# Patient Record
Sex: Female | Born: 1995 | Race: Black or African American | Hispanic: No | Marital: Single | Smoking: Never smoker
Health system: Southern US, Community
[De-identification: ages and names within clinical notes are randomized; demographics above are authoritative.]

## PROBLEM LIST (undated history)

## (undated) DIAGNOSIS — N809 Endometriosis, unspecified: Secondary | ICD-10-CM

## (undated) HISTORY — PX: TYMPANOSTOMY TUBE PLACEMENT: SHX32

---

## 2016-02-12 DIAGNOSIS — F4322 Adjustment disorder with anxiety: Secondary | ICD-10-CM | POA: Diagnosis not present

## 2016-02-26 DIAGNOSIS — F4322 Adjustment disorder with anxiety: Secondary | ICD-10-CM | POA: Diagnosis not present

## 2016-03-11 DIAGNOSIS — F4322 Adjustment disorder with anxiety: Secondary | ICD-10-CM | POA: Diagnosis not present

## 2016-06-20 DIAGNOSIS — S80912A Unspecified superficial injury of left knee, initial encounter: Secondary | ICD-10-CM | POA: Diagnosis not present

## 2016-06-20 DIAGNOSIS — S80212A Abrasion, left knee, initial encounter: Secondary | ICD-10-CM | POA: Diagnosis not present

## 2016-06-20 DIAGNOSIS — M25572 Pain in left ankle and joints of left foot: Secondary | ICD-10-CM | POA: Diagnosis not present

## 2016-08-18 DIAGNOSIS — F411 Generalized anxiety disorder: Secondary | ICD-10-CM | POA: Diagnosis not present

## 2016-12-17 DIAGNOSIS — Z3009 Encounter for other general counseling and advice on contraception: Secondary | ICD-10-CM | POA: Diagnosis not present

## 2017-09-24 DIAGNOSIS — Z118 Encounter for screening for other infectious and parasitic diseases: Secondary | ICD-10-CM | POA: Diagnosis not present

## 2017-09-24 DIAGNOSIS — Z01419 Encounter for gynecological examination (general) (routine) without abnormal findings: Secondary | ICD-10-CM | POA: Diagnosis not present

## 2017-09-24 DIAGNOSIS — Z6833 Body mass index (BMI) 33.0-33.9, adult: Secondary | ICD-10-CM | POA: Diagnosis not present

## 2018-04-08 DIAGNOSIS — Z114 Encounter for screening for human immunodeficiency virus [HIV]: Secondary | ICD-10-CM | POA: Diagnosis not present

## 2018-04-08 DIAGNOSIS — Z1159 Encounter for screening for other viral diseases: Secondary | ICD-10-CM | POA: Diagnosis not present

## 2018-04-08 DIAGNOSIS — Z113 Encounter for screening for infections with a predominantly sexual mode of transmission: Secondary | ICD-10-CM | POA: Diagnosis not present

## 2018-04-08 DIAGNOSIS — Z118 Encounter for screening for other infectious and parasitic diseases: Secondary | ICD-10-CM | POA: Diagnosis not present

## 2018-08-12 DIAGNOSIS — F4310 Post-traumatic stress disorder, unspecified: Secondary | ICD-10-CM | POA: Diagnosis not present

## 2018-08-26 DIAGNOSIS — F4312 Post-traumatic stress disorder, chronic: Secondary | ICD-10-CM | POA: Diagnosis not present

## 2018-11-10 DIAGNOSIS — Z118 Encounter for screening for other infectious and parasitic diseases: Secondary | ICD-10-CM | POA: Diagnosis not present

## 2018-11-10 DIAGNOSIS — Z6836 Body mass index (BMI) 36.0-36.9, adult: Secondary | ICD-10-CM | POA: Diagnosis not present

## 2018-11-10 DIAGNOSIS — Z01419 Encounter for gynecological examination (general) (routine) without abnormal findings: Secondary | ICD-10-CM | POA: Diagnosis not present

## 2018-11-22 ENCOUNTER — Ambulatory Visit (INDEPENDENT_AMBULATORY_CARE_PROVIDER_SITE_OTHER): Payer: BC Managed Care – PPO

## 2018-11-22 ENCOUNTER — Other Ambulatory Visit: Payer: Self-pay

## 2018-11-22 ENCOUNTER — Encounter (HOSPITAL_COMMUNITY): Payer: Self-pay

## 2018-11-22 ENCOUNTER — Ambulatory Visit (HOSPITAL_COMMUNITY)
Admission: EM | Admit: 2018-11-22 | Discharge: 2018-11-22 | Disposition: A | Discharge status: Home / Self Care | Payer: BC Managed Care – PPO | Attending: Emergency Medicine | Admitting: Emergency Medicine

## 2018-11-22 DIAGNOSIS — M7989 Other specified soft tissue disorders: Secondary | ICD-10-CM | POA: Diagnosis not present

## 2018-11-22 DIAGNOSIS — W19XXXA Unspecified fall, initial encounter: Secondary | ICD-10-CM

## 2018-11-22 DIAGNOSIS — S99912A Unspecified injury of left ankle, initial encounter: Secondary | ICD-10-CM | POA: Diagnosis not present

## 2018-11-22 DIAGNOSIS — S93402A Sprain of unspecified ligament of left ankle, initial encounter: Secondary | ICD-10-CM

## 2018-11-22 DIAGNOSIS — M79671 Pain in right foot: Secondary | ICD-10-CM | POA: Diagnosis not present

## 2018-11-22 DIAGNOSIS — S99921A Unspecified injury of right foot, initial encounter: Secondary | ICD-10-CM | POA: Diagnosis not present

## 2018-11-22 DIAGNOSIS — W1789XA Other fall from one level to another, initial encounter: Secondary | ICD-10-CM | POA: Diagnosis not present

## 2018-11-22 DIAGNOSIS — M25572 Pain in left ankle and joints of left foot: Secondary | ICD-10-CM | POA: Diagnosis not present

## 2018-11-22 HISTORY — DX: Endometriosis, unspecified: N80.9

## 2018-11-22 NOTE — ED Provider Notes (Signed)
[facility]    CSN: [id number] Arrival date & time: 11/22/18  1858     History   Chief Complaint Chief Complaint  Patient presents with  . Fall    HPI Beth Harrington is a 23 y.o. female presenting for right dorsal foot pain and left ankle pain and swelling status post fall earlier this afternoon.  Patient was out looking at apartments, tripped over a raised patch of lawn.  Denies head trauma, loss of consciousness.  Patient is able to ambulate, though endorsing pain with doing so.    Past Medical History:  Diagnosis Date  . Endometriosis     There are no active problems to display for this patient.   Past Surgical History:  Procedure Laterality Date  . TYMPANOSTOMY TUBE PLACEMENT      OB History   No obstetric history on file.      Home Medications    Prior to Admission medications   Not on File    Family History Family History  Family history unknown: Yes    Social History Social History   Tobacco Use  . Smoking status: Never Smoker  . Smokeless tobacco: Never Used  Substance Use Topics  . Alcohol use: Not on file  . Drug use: Not on file     Allergies   Bactrim [sulfamethoxazole-trimethoprim]   Review of Systems Review of Systems  Constitutional: Negative for fatigue and fever.  HENT: Negative for ear pain, sinus pain, sore throat and voice change.   Eyes: Negative for pain, redness and visual disturbance.  Respiratory: Negative for cough and shortness of breath.   Cardiovascular: Negative for chest pain and palpitations.  Gastrointestinal: Negative for abdominal pain, diarrhea and vomiting.  Musculoskeletal: Negative for arthralgias and myalgias.       Positive for right foot, left ankle pain  Skin: Positive for wound. Negative for rash.  Neurological: Negative for syncope and headaches.  Hematological: Does not bruise/bleed easily.     Physical Exam Triage Vital Signs ED Triage Vitals [11/22/18 1934]  Enc Vitals  Group     BP 125/81     Pulse Rate 78     Resp 18     Temp 98.9 F (37.2 C)     Temp Source Oral     SpO2 99 %     Weight      Height      Head Circumference      Peak Flow      Pain Score 8     Pain Loc      Pain Edu?      Excl. in [place]?    No data found.  Updated Vital Signs BP 125/81 (BP Location: Right Arm)   Pulse 78   Temp 98.9 F (37.2 C) (Oral)   Resp 18   LMP 11/02/2018 Comment: endometriosis  SpO2 99%   Visual Acuity Right Eye Distance:   Left Eye Distance:   Bilateral Distance:    Right Eye Near:   Left Eye Near:    Bilateral Near:     Physical Exam Constitutional:      General: She is not in acute distress. HENT:     Head: Normocephalic and atraumatic.  Eyes:     General: No scleral icterus.    Pupils: Pupils are equal, round, and reactive to light.  Cardiovascular:     Rate and Rhythm: Normal rate.  Pulmonary:     Effort: Pulmonary effort is normal.  Musculoskeletal:  Comments: No obvious deformity, ecchymosis, swelling of right foot.  Full active range of motion without significant pain of right ankle, knee. Left lateral malleolus swelling without ecchymosis.  Tender palpation on medial and lateral malleoli with decreased active range of motion second to pain in ankle.  Full active range of motion with 5/5 strength of ipsilateral knee.  Skin:    General: Skin is warm.     Capillary Refill: Capillary refill takes less than 2 seconds.     Coloration: Skin is not jaundiced or pale.     Comments: Right medial MTP abrasion without foreign body or active bleeding.  Neurological:     General: No focal deficit present.     Mental Status: She is alert and oriented to person, place, and time.      UC Treatments / Results  Labs (all labs ordered are listed, but only abnormal results are displayed) Labs Reviewed - No data to display  EKG   Radiology Dg Ankle Complete Left  Result Date: 11/22/2018 CLINICAL DATA:  Fall, pain. EXAM: LEFT  ANKLE COMPLETE - 3+ VIEW COMPARISON:  None. FINDINGS: Osseous alignment is normal. Ankle mortise is symmetric. No fracture line or displaced fracture fragment seen. Visualized portions of the hindfoot and midfoot appear intact and normally aligned. Soft tissue swelling overlying the lateral malleolus. IMPRESSION: Soft tissue swelling. No osseous fracture or dislocation. Electronically Signed   By: Bary RichardStan  Maynard M.D.   On: 11/22/2018 19:50   Dg Foot Complete Right  Result Date: 11/22/2018 CLINICAL DATA:  Fall today, RIGHT foot pain. EXAM: RIGHT FOOT COMPLETE - 3+ VIEW COMPARISON:  None. FINDINGS: Osseous alignment is normal. No fracture line or displaced fracture fragment seen. Soft tissues about the RIGHT foot are unremarkable. IMPRESSION: Negative. Electronically Signed   By: Bary RichardStan  Maynard M.D.   On: 11/22/2018 19:49    Procedures Procedures (including critical care time)  Medications Ordered in UC Medications - No data to display  Initial Impression / Assessment and Plan / UC Course  I have reviewed the triage vital signs and the nursing notes.  Pertinent labs & imaging results that were available during my care of the patient were reviewed by me and considered in my medical decision making (see chart for details).     1.  Moderate left ankle sprain X-ray of right foot and left ankle done in office, reviewed radiology: Negative for fracture, displacement.  Positive for soft tissue swelling in left ankle.  Patient given Ace wrap with crutches in office.  Return precautions discussed, patient verbalized understanding and is agreeable to plan. Final Clinical Impressions(s) / UC Diagnoses   Final diagnoses:  Fall, initial encounter  Moderate left ankle sprain, initial encounter     Discharge Instructions     May alternate OTC ibuprofen and Tylenol as written in conjunction with ice, elevation for pain and swelling relief. Practice ankle exercises as discussed at appointment. Follow-up  with Ortho should you still have difficulty ambulating after 1 week. Return for worsening pain, swelling, redness, heat, fever.    ED Prescriptions    None     Controlled Substance Prescriptions [place] Controlled Substance Registry consulted? Not Applicable   Shea EvansHall-Potvin, , [place]-C 11/22/18 2018

## 2018-11-22 NOTE — Discharge Instructions (Addendum)
May alternate OTC ibuprofen and Tylenol as written in conjunction with ice, elevation for pain and swelling relief. Practice ankle exercises as discussed at appointment. Follow-up with Ortho should you still have difficulty ambulating after 1 week. Return for worsening pain, swelling, redness, heat, fever.

## 2018-11-22 NOTE — ED Triage Notes (Signed)
Pt presents with right foot injury and left ankle injury after a fall today.

## 2020-07-20 IMAGING — DX LEFT ANKLE COMPLETE - 3+ VIEW
3 series · 3 of 3 positions shown · non-contrast
Comparison: None.

CLINICAL DATA: Fall, pain.

EXAM:
LEFT ANKLE COMPLETE - 3+ VIEW

[ankle ap]
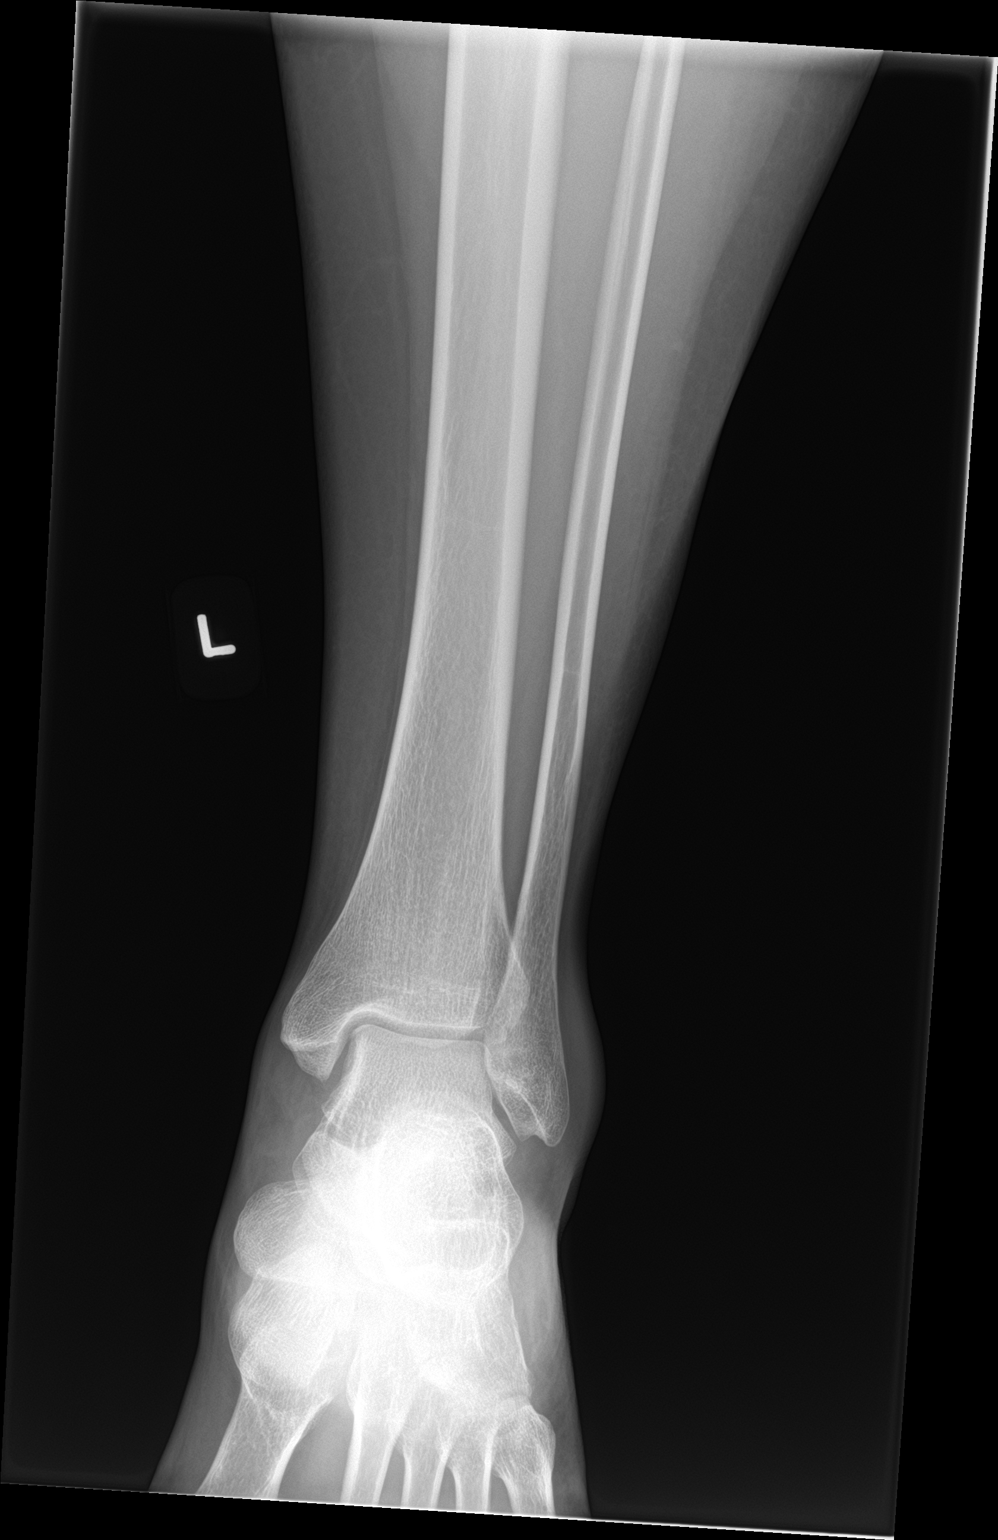

[ankle obl]
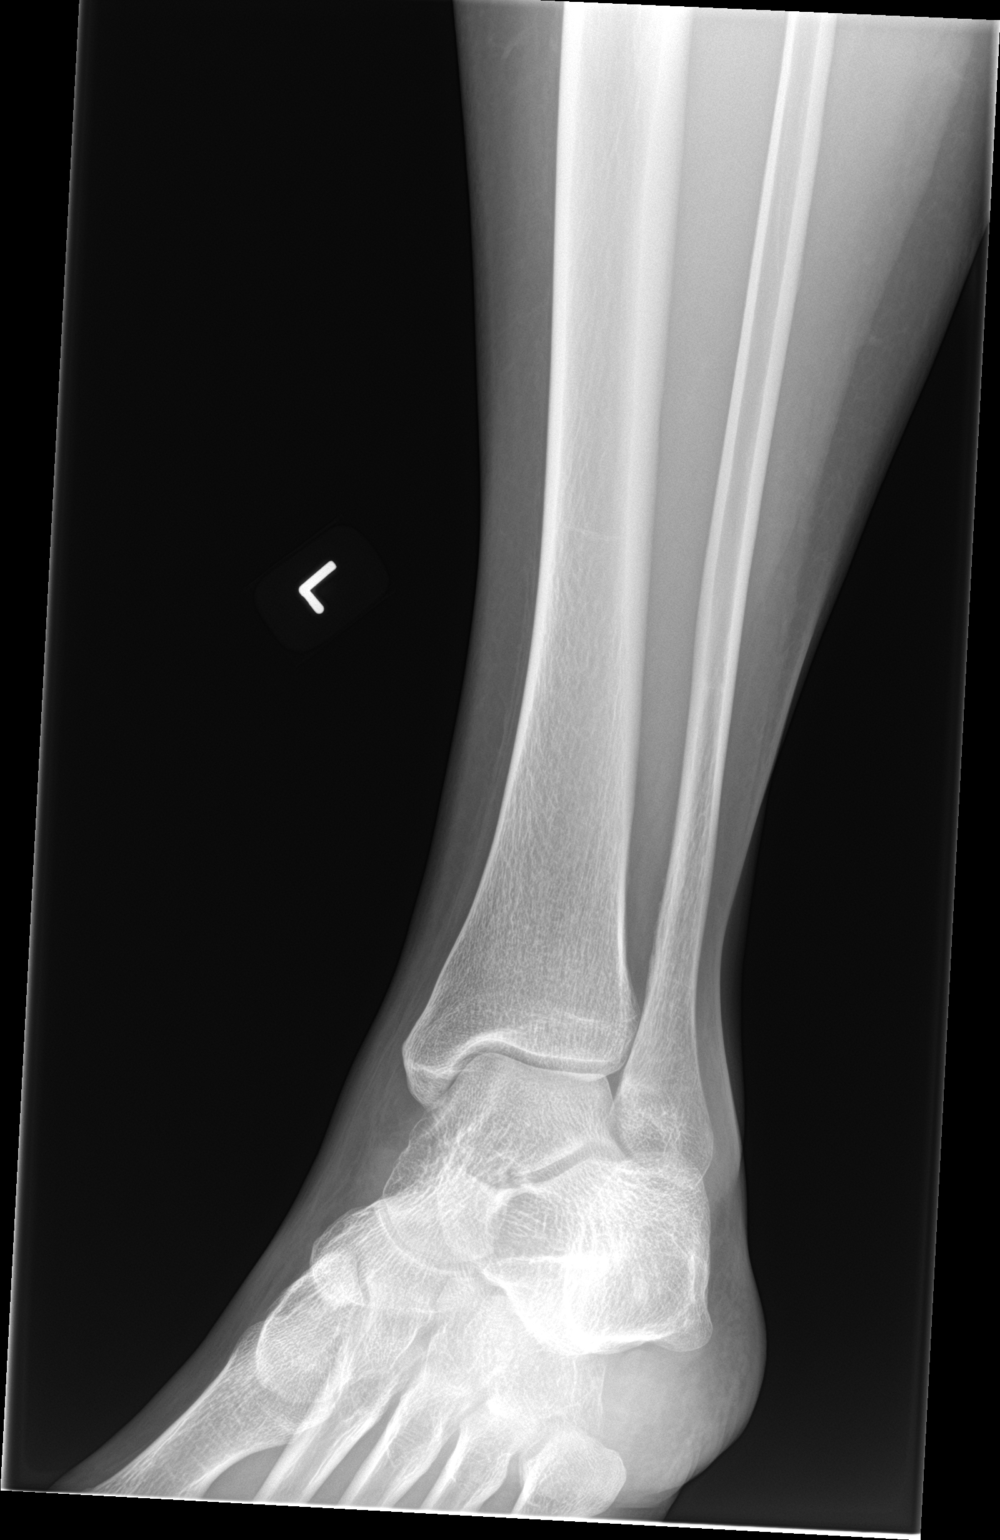

[ankle lat]
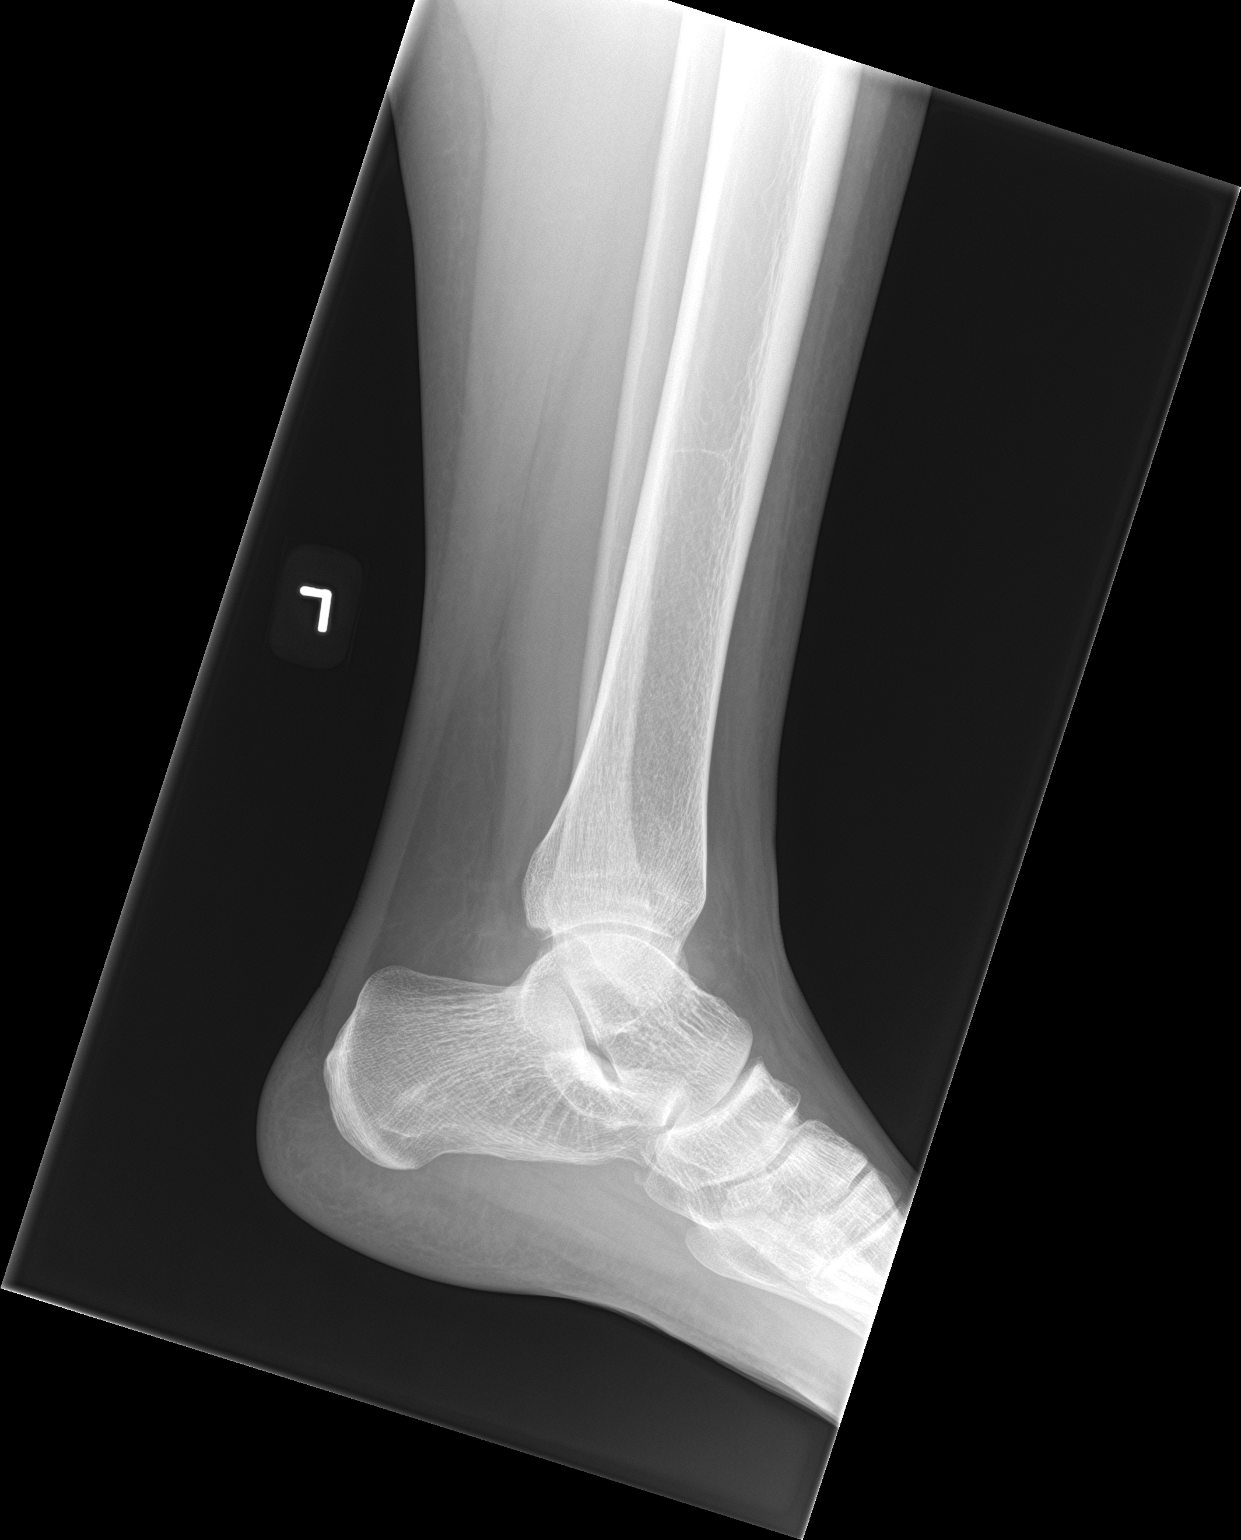

[3 of 3 positions shown; findings below may reference images not displayed]

FINDINGS: Osseous alignment is normal. Ankle mortise is symmetric. No fracture
line or displaced fracture fragment seen. Visualized portions of the
hindfoot and midfoot appear intact and normally aligned. Soft tissue
swelling overlying the lateral malleolus.
IMPRESSION: Soft tissue swelling. No osseous fracture or dislocation.

## 2020-07-20 IMAGING — DX RIGHT FOOT COMPLETE - 3+ VIEW
3 series · 3 of 3 positions shown · non-contrast
Comparison: None.

CLINICAL DATA: Fall today, RIGHT foot pain.

EXAM:
RIGHT FOOT COMPLETE - 3+ VIEW

[foot ap]
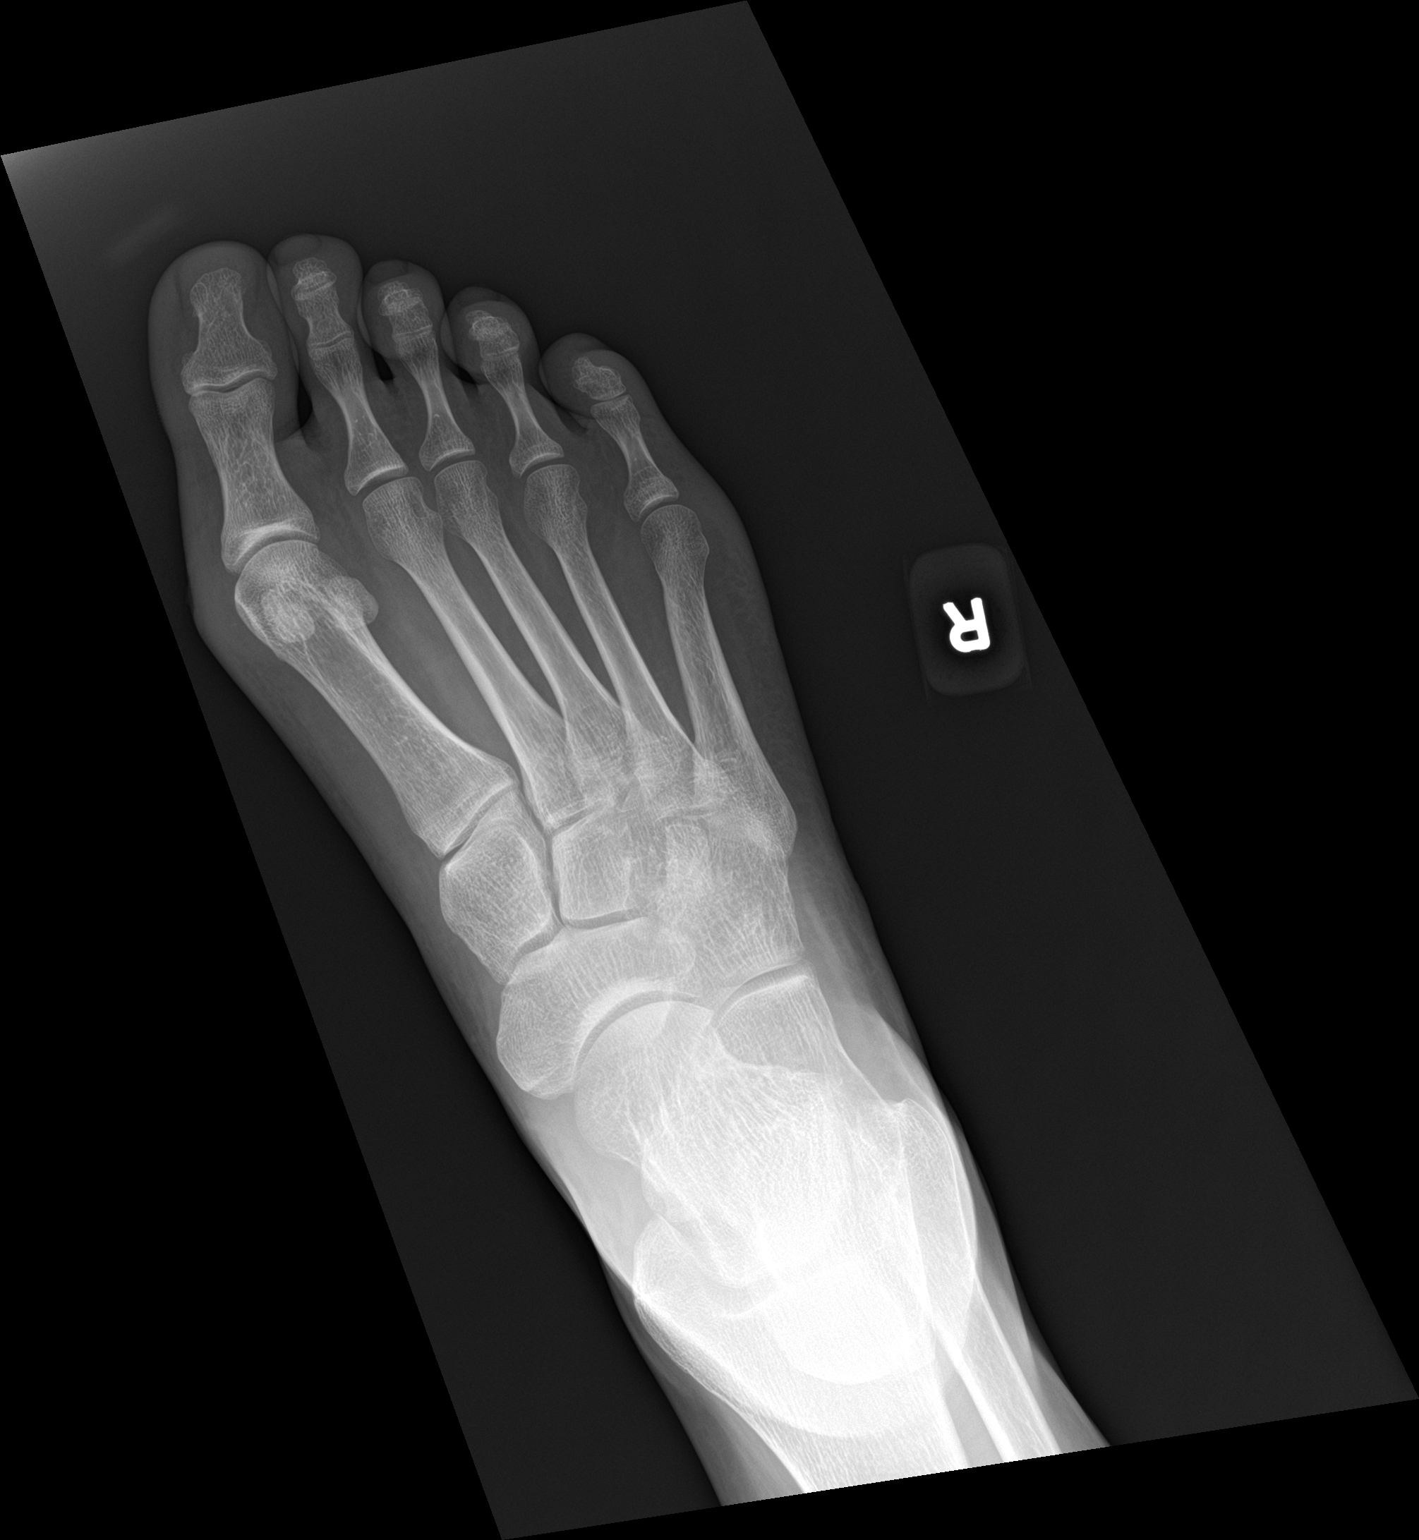

[foot obl]
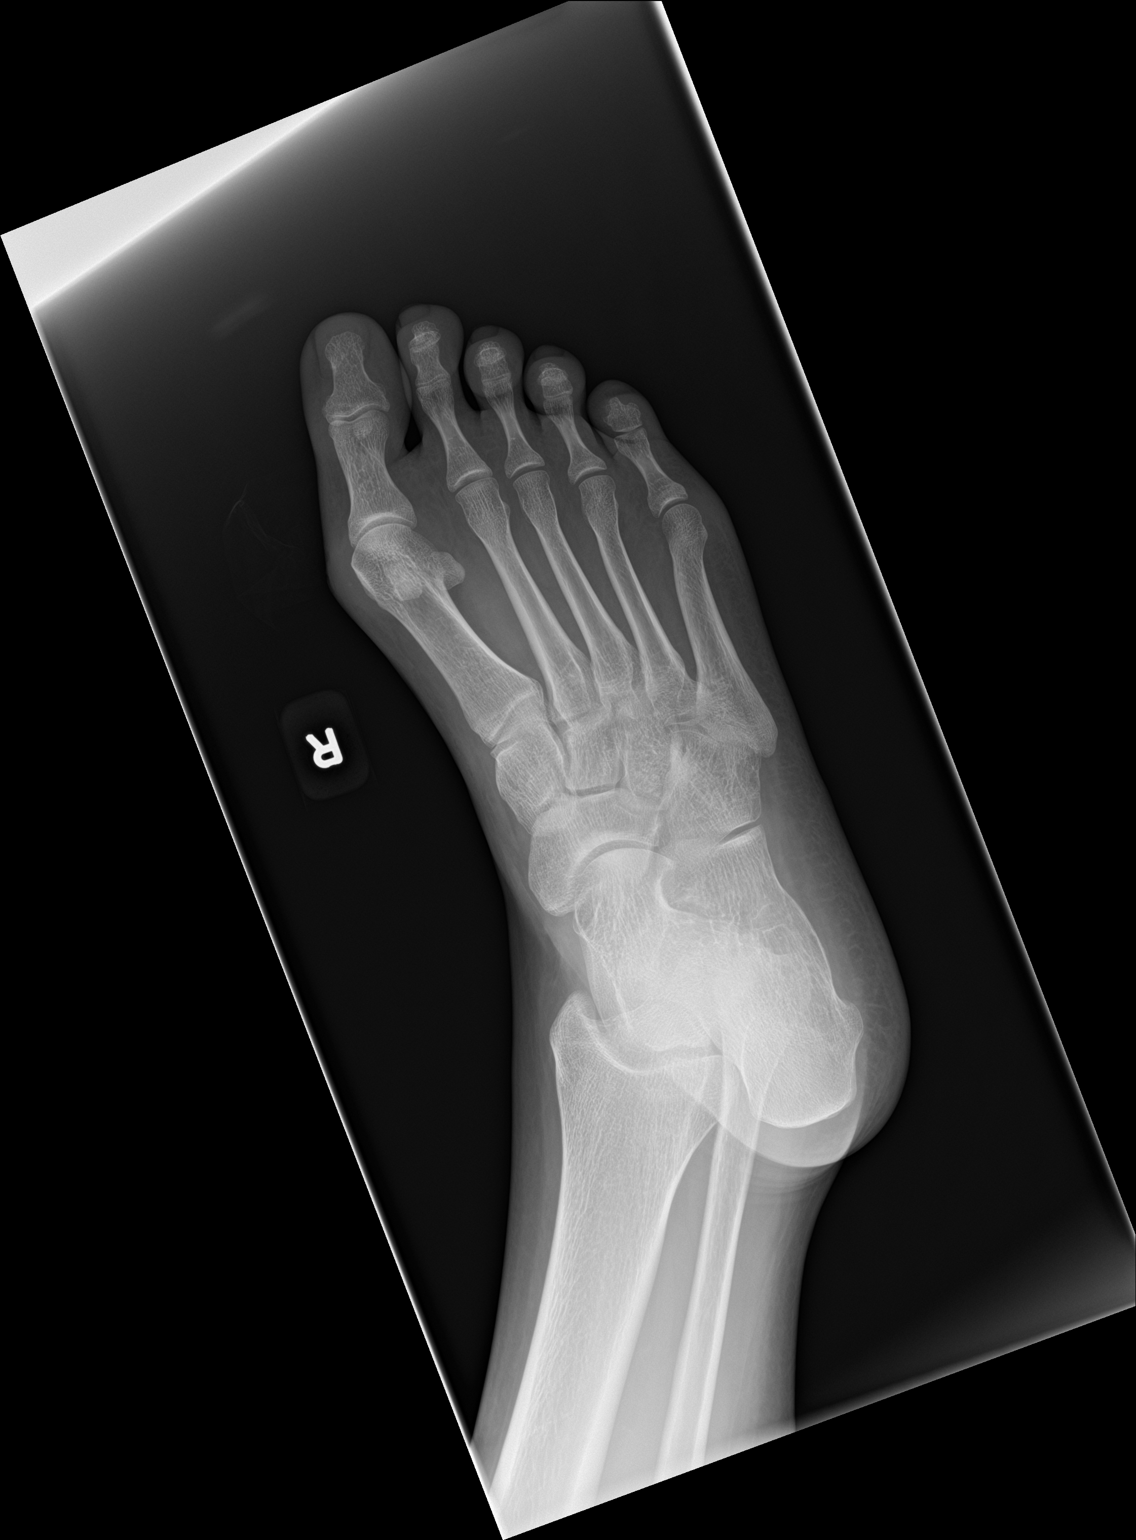

[foot lat]
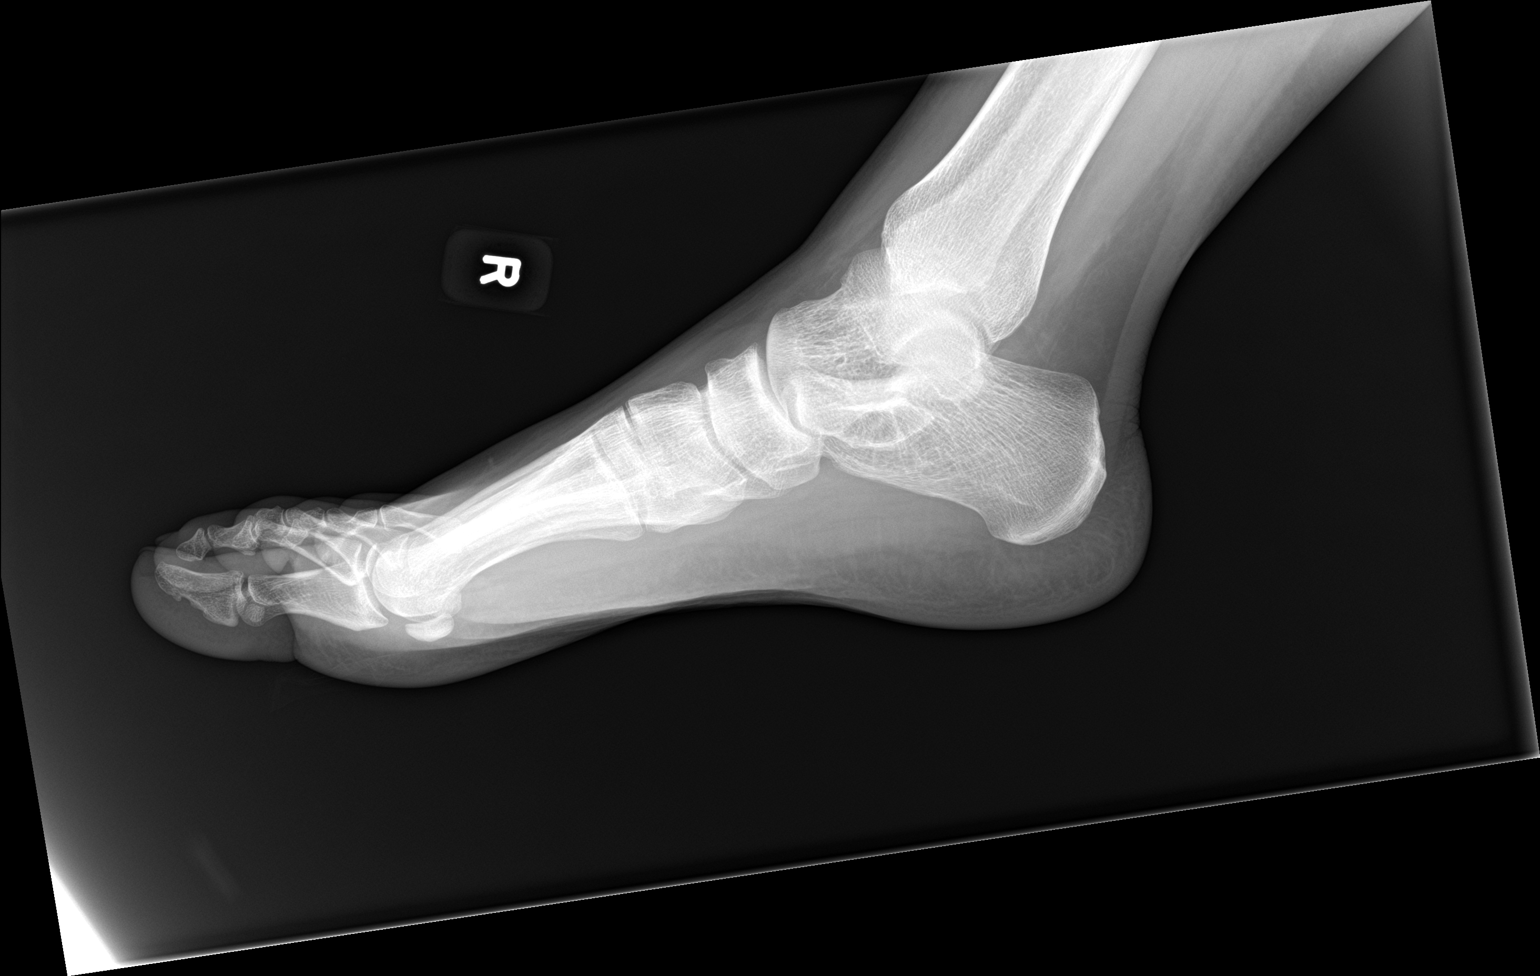

[3 of 3 positions shown; findings below may reference images not displayed]

FINDINGS: Osseous alignment is normal. No fracture line or displaced fracture
fragment seen. Soft tissues about the RIGHT foot are unremarkable.
IMPRESSION: Negative.

## 2022-02-02 ENCOUNTER — Other Ambulatory Visit: Payer: Self-pay

## 2022-02-02 ENCOUNTER — Emergency Department (HOSPITAL_COMMUNITY)
Admission: EM | Admit: 2022-02-02 | Discharge: 2022-02-03 | Disposition: A | Discharge status: Home / Self Care | Payer: Managed Care, Other (non HMO) | Attending: Nurse Practitioner | Admitting: Nurse Practitioner

## 2022-02-02 ENCOUNTER — Encounter (HOSPITAL_COMMUNITY): Payer: Self-pay

## 2022-02-02 DIAGNOSIS — R440 Auditory hallucinations: Secondary | ICD-10-CM | POA: Diagnosis not present

## 2022-02-02 DIAGNOSIS — R7401 Elevation of levels of liver transaminase levels: Secondary | ICD-10-CM | POA: Diagnosis not present

## 2022-02-02 DIAGNOSIS — F333 Major depressive disorder, recurrent, severe with psychotic symptoms: Secondary | ICD-10-CM | POA: Diagnosis present

## 2022-02-02 DIAGNOSIS — F22 Delusional disorders: Secondary | ICD-10-CM | POA: Diagnosis not present

## 2022-02-02 DIAGNOSIS — Z20822 Contact with and (suspected) exposure to covid-19: Secondary | ICD-10-CM | POA: Insufficient documentation

## 2022-02-02 DIAGNOSIS — R4182 Altered mental status, unspecified: Secondary | ICD-10-CM | POA: Insufficient documentation

## 2022-02-02 DIAGNOSIS — Z1152 Encounter for screening for COVID-19: Secondary | ICD-10-CM | POA: Diagnosis not present

## 2022-02-02 DIAGNOSIS — Z046 Encounter for general psychiatric examination, requested by authority: Secondary | ICD-10-CM | POA: Diagnosis not present

## 2022-02-02 DIAGNOSIS — F32A Depression, unspecified: Secondary | ICD-10-CM

## 2022-02-02 NOTE — ED Notes (Signed)
Olena Mater (pt friend) [phone] is available for any questions or concerns regarding pt

## 2022-02-02 NOTE — ED Triage Notes (Signed)
Pt BIB [facility] with IVC orders. PT IVC  states" pt has anxiety disorder, pt is hearing voices, pt quit job within last two weeks, pt mentioned she had thoughts of harming herself and believes she has witchcraft on her. Pt is not eating,sleeping, or bathing. Pt is afraid to leave her home: pt states "things are happening inside her body"; pt states' she has to remain in isolation. Pt is a danger to herself.

## 2022-02-03 ENCOUNTER — Emergency Department (HOSPITAL_COMMUNITY): Payer: Managed Care, Other (non HMO)

## 2022-02-03 DIAGNOSIS — F333 Major depressive disorder, recurrent, severe with psychotic symptoms: Secondary | ICD-10-CM | POA: Diagnosis present

## 2022-02-03 DIAGNOSIS — F22 Delusional disorders: Secondary | ICD-10-CM | POA: Diagnosis present

## 2022-02-03 LAB — CBC WITH DIFFERENTIAL/PLATELET
Abs Immature Granulocytes: 0.01 10*3/uL (ref 0.00–0.07)
Basophils Absolute: 0 10*3/uL (ref 0.0–0.1)
Basophils Relative: 0 %
Eosinophils Absolute: 0.2 10*3/uL (ref 0.0–0.5)
Eosinophils Relative: 2 %
HCT: 36.9 % (ref 36.0–46.0)
Hemoglobin: 12 g/dL (ref 12.0–15.0)
Immature Granulocytes: 0 %
Lymphocytes Relative: 28 %
Lymphs Abs: 2.4 10*3/uL (ref 0.7–4.0)
MCH: 26.5 pg (ref 26.0–34.0)
MCHC: 32.5 g/dL (ref 30.0–36.0)
MCV: 81.6 fL (ref 80.0–100.0)
Monocytes Absolute: 0.6 10*3/uL (ref 0.1–1.0)
Monocytes Relative: 7 %
Neutro Abs: 5.4 10*3/uL (ref 1.7–7.7)
Neutrophils Relative %: 63 %
Platelets: 257 10*3/uL (ref 150–400)
RBC: 4.52 MIL/uL (ref 3.87–5.11)
RDW: 14 % (ref 11.5–15.5)
WBC: 8.6 10*3/uL (ref 4.0–10.5)
nRBC: 0 % (ref 0.0–0.2)

## 2022-02-03 LAB — RAPID URINE DRUG SCREEN, HOSP PERFORMED
Amphetamines: NOT DETECTED
Barbiturates: NOT DETECTED
Benzodiazepines: NOT DETECTED
Cocaine: NOT DETECTED
Opiates: NOT DETECTED
Tetrahydrocannabinol: NOT DETECTED

## 2022-02-03 LAB — COMPREHENSIVE METABOLIC PANEL
ALT: 79 U/L — ABNORMAL HIGH (ref 0–44)
AST: 46 U/L — ABNORMAL HIGH (ref 15–41)
Albumin: 3.6 g/dL (ref 3.5–5.0)
Alkaline Phosphatase: 83 U/L (ref 38–126)
Anion gap: 10 (ref 5–15)
BUN: 9 mg/dL (ref 6–20)
CO2: 22 mmol/L (ref 22–32)
Calcium: 9.2 mg/dL (ref 8.9–10.3)
Chloride: 106 mmol/L (ref 98–111)
Creatinine, Ser: 0.91 mg/dL (ref 0.44–1.00)
GFR, Estimated: >60 mL/min (ref >60)
Glucose, Bld: 102 mg/dL — ABNORMAL HIGH (ref 70–99)
Potassium: 3.6 mmol/L (ref 3.5–5.1)
Sodium: 138 mmol/L (ref 135–145)
Total Bilirubin: 1 mg/dL (ref 0.3–1.2)
Total Protein: 8 g/dL (ref 6.5–8.1)

## 2022-02-03 LAB — I-STAT BETA HCG BLOOD, ED (MC, WL, AP ONLY): I-stat hCG, quantitative: <5 m[IU]/mL (ref <5)

## 2022-02-03 LAB — ETHANOL: Alcohol, Ethyl (B): <10 mg/dL (ref <10)

## 2022-02-03 LAB — RESP PANEL BY RT-PCR (FLU A&B, COVID) ARPGX2
Influenza A by PCR: NEGATIVE
Influenza B by PCR: NEGATIVE
SARS Coronavirus 2 by RT PCR: NEGATIVE

## 2022-02-03 MED ORDER — NORGESTIMATE-ETH ESTRADIOL 0.25-35 MG-MCG PO TABS: 1.0000 | ORAL_TABLET | Freq: Every day | ORAL | Status: DC

## 2022-02-03 NOTE — Progress Notes (Signed)
Pt was accepted to [facility] 02/03/22; Bed Assignment [facility]  Pt meets inpatient criteria per Lindon Romp, NP  Attending Physician will be Dr. Lavella Lemons  Report can be called to: [phone]  Pt can arrive after: BED IS [place] Team notified: Laurena Spies, RN, and Lindon Romp, NP  Nadara Mode, [place] 02/03/2022 @ 4:52 PM

## 2022-02-03 NOTE — Progress Notes (Addendum)
Pt accepted to [facility] today. CSW communicated and updated pt accepted to [facility].   Nadara Mode, LCSWA 02/03/2022 @ 4:45 PM

## 2022-02-03 NOTE — ED Notes (Signed)
[facility] [employer] [facility] returned call, will be available to Tx this Pt at approx. [phone] today.
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
## 2022-02-03 NOTE — Progress Notes (Signed)
Inpatient Behavioral Health Placement  Pt meets inpatient criteria per Lindon Romp, NP.  Referral was sent to the following facilities;   Destination Service Provider Address Phone Fax  [address] [place] [address] [phone]  [facility], [place] [address] [phone]  [facility]  [place], [place] [address] [phone]  [facility]  [address], [place] [address] [phone]  [facility] [address] [place] [address] [phone]  [facility]  [address] [place] [address] [place]  [facility]  [address] [place] [address] [phone]  [facility]  [address] [place] [address] [phone]  [facility]  [address] [place] [address] [phone] [facility]  [address] [place] [address] [phone]  [facility]  [address] [place] [address] [phone]  [facility]  [address] [place] [address] [phone]  [facility]  [address], [place] [address] [phone] [place] [address], [place] [address] [place]  [facility]  [place], [place] [address] [phone]  [facility]  [address] [place] [address] [phone]    Situation ongoing,  CSW will follow up.   Benjaman Kindler, MSW, [facility] 02/03/2022  @ 4:41 PM

## 2022-02-03 NOTE — ED Notes (Signed)
Call the main line [phone] hit 2 and tell them you need [facility] unit [phone] to give report

## 2022-02-03 NOTE — Consult Note (Cosign Needed Addendum)
BH ED ASSESSMENT   Reason for Consult:  Psychosis Referring Physician:  Berle Mull, PA-C Patient Identification: Beth Harrington MRN:  [id number] ED Chief Complaint: Severe recurrent major depression with psychotic features [facility])  Diagnosis:  Principal Problem:   Severe recurrent major depression with psychotic features (HCC) Active Problems:   Delusion [facility])   ED Assessment Time Calculation: Start Time: 1000 Stop Time: 1100 Total Time in Minutes (Assessment Completion): 60   Subjective:   Beth Harrington is a 26 y.o. female patient with a history of depression and anxiety who presented to [facility] under IVC. Patient was petitioned for IVC her friend Cecile Hearing [phone]). Per IVC, "respondent has some form of anxiety disorder; respondent states "she is hearing voices"; respondent quit her job within the last 2 weeks; respondent mentioned she had thoughts of harming herself; respondent believes she has witchcraft on her; respondent is not eating, sleeping, or bathing; respondent is afraid to leave her home; respondent stated "things are happening inside of her body"; respondent stated "she has to remain in isolation"; respondent is a danger to herself."  HPI:   Patient reports that she quit her job at [employer] a few weeks ago.  She states that she quit suddenly and did not give a notice or notify anyone that she was quitting.  She reports that she was having difficulty focusing and concentrating.  States that she was "messing up stuff that she could normally do."  She states that she has been isolating herself, not keeping up with her normal routine, not performing hygiene, not eating well, and crying.  She reports a weight loss of approximately 10 to 15 pounds over the last several months.  Reports that she typically sleeps 6 to 7 hours per night.  States that recently she has been sleeping less but she is not sure how much she is sleeping.  She reports that sometimes she feels  rested and sometimes she feels tired.  She denies a clear history of mania/hypomania.  When asked specifically about depression and sadness, the patient is hesitant to respond and eventually states that she has maybe had some mild depression.  Patient states "I honestly believe in witches. I feel like witches can control you and talk to you from a distance.  I do not feel safe."  Patient reports "unseen threats" and states that the witches cannot be seen so she feels like no one understands what she is going through.  Patient reports that she has had various symptoms, such as foot pain and shivering, but when she gets medical checkups nothing is found to be wrong.  She feels that witches are causing these symptoms.  She states "I feel like if you scan my head you will not find anything wrong."  When asked about auditory hallucinations she is hesitant to respond.  She states that she prefers not to discuss that because people do not understand what she is going through and she does not want it in her medical record.  Provider encouraged patient to provide honest responses so that we could develop an appropriate treatment plan.  Patient continued to avoid answering regarding auditory hallucinations.  She denies visual hallucinations.  She states that she has avoided sharing her thoughts regarding witches, because she feels like no one else has experienced what she was going through, so no one will be able to help her.  She states that she finally felt comfortable sharing these thoughts with a friend.  She states that  she went to this friend's house and the friend appeared to understand what she was talking about.  States her friend her friend took her back to her house and later the friend showed up with police to encourage her to go to the hospital for psychiatric evaluation.  She denies use of tobacco products, marijuana, alcohol, and other substances.  UDS negative.  BAL less than 10.  Patient reports that  at the age of 26 she was diagnosed with adjustment disorder and anxiety.  She reports that she was also hearing voices at that time and was started on olanzapine.  She states that the olanzapine made her feel "foggy."  So she did not continue taking it.  She states that she has not taken any other psychotropic medications.  She states that she has seen therapists and counselors off and on since the age of 26.  She denies history of being diagnosed with a thought disorder or bipolar disorder.  Denies a history of inpatient psychiatric admissions.  She reports that she thinks she had a distant uncle with schizophrenia.  States it was probably like a great great uncle.  Denies a history of schizophrenia, bipolar disorder, and other mental health disorders in first-degree relatives.  Patient reports that she has 2 college degrees and lives alone in her house in [place].  She is not currently employed.  On evaluation, patient is sitting up in her bed.  She is fairly groomed.  Eye contact is fair.  Speech is clear and coherent.  Reports mood is depressed and anxious.  Affect remains flat during the assessment.  Thought process is coherent and mostly linear, circumstantial at times. Thought content consist of delusions and paranoia as mentioned above.  She will not answer questions regarding auditory hallucinations.  She denies visual hallucinations.  No indication that she is responding to internal stimuli.  She endorses some thoughts of passive suicidal ideations without any intent or plan.  She denies current suicidal ideations.  She denies homicidal ideations.  She denies substance abuse.  Past Psychiatric History: See above  Risk to Self or Others: Is the patient at risk to self? Yes Has the patient been a risk to self in the past 6 months? No Has the patient been a risk to self within the distant past? No Is the patient a risk to others? No Has the patient been a risk to others in the past 6 months?  No Has the patient been a risk to others within the distant past? No  [place] Scale:  Flowsheet Row ED from 02/02/2022 in [facility]  C-SSRS RISK CATEGORY Error: Q3, 4, or 5 should not be populated when Q2 is No       AIMS:  , , ,  ,   ASAM:    Substance Abuse:  Alcohol / Drug Use History of alcohol / drug use?: No history of alcohol / drug abuse  Past Medical History:  Past Medical History:  Diagnosis Date   Endometriosis     Past Surgical History:  Procedure Laterality Date   TYMPANOSTOMY TUBE PLACEMENT     Family History:  Family History  Family history unknown: Yes   Family Psychiatric  History: Schizophrenia-possibly a distant Uncle  Social History:  Social History   Substance and Sexual Activity  Alcohol Use None     Social History   Substance and Sexual Activity  Drug Use Not on file    Social History   Socioeconomic  History   Marital status: Single    Spouse name: Not on file   Number of children: Not on file   Years of education: Not on file   Highest education level: Not on file  Occupational History   Not on file  Tobacco Use   Smoking status: Never   Smokeless tobacco: Never  Substance and Sexual Activity   Alcohol use: Not on file   Drug use: Not on file   Sexual activity: Not on file  Other Topics Concern   Not on file  Social History Narrative   Not on file   Social Determinants of Health   Financial Resource Strain: Not on file  Food Insecurity: Not on file  Transportation Needs: Not on file  Physical Activity: Not on file  Stress: Not on file  Social Connections: Not on file   Additional Social History:    Allergies:   Allergies  Allergen Reactions   Bactrim [Sulfamethoxazole-Trimethoprim]     Labs:  Results for orders placed or performed during the hospital encounter of 02/02/22 (from the past 48 hour(s))  Resp Panel by RT-PCR (Flu A&B, Covid) Anterior Nasal Swab     Status: None    Collection Time: 02/02/22 12:15 AM   Specimen: Anterior Nasal Swab  Result Value Ref Range   SARS Coronavirus 2 by RT PCR NEGATIVE NEGATIVE    Comment: (NOTE) SARS-CoV-2 target nucleic acids are NOT DETECTED.  The SARS-CoV-2 RNA is generally detectable in upper respiratory specimens during the acute phase of infection. The lowest concentration of SARS-CoV-2 viral copies this assay can detect is 138 copies/mL. A negative result does not preclude SARS-Cov-2 infection and should not be used as the sole basis for treatment or other patient management decisions. A negative result may occur with  improper specimen collection/handling, submission of specimen other than nasopharyngeal swab, presence of viral mutation(s) within the areas targeted by this assay, and inadequate number of viral copies(<138 copies/mL). A negative result must be combined with clinical observations, patient history, and epidemiological information. The expected result is Negative.  Fact Sheet for Patients:  BloggerCourse.com  Fact Sheet for Healthcare Providers:  SeriousBroker.it  This test is no t yet approved or cleared by the [place] FDA and  has been authorized for detection and/or diagnosis of SARS-CoV-2 by FDA under an Emergency Use Authorization (EUA). This EUA will remain  in effect (meaning this test can be used) for the duration of the COVID-19 declaration under Section 564(b)(1) of the Act, 21 U.S.C.section 360bbb-3(b)(1), unless the authorization is terminated  or revoked sooner.       Influenza A by PCR NEGATIVE NEGATIVE   Influenza B by PCR NEGATIVE NEGATIVE    Comment: (NOTE) The Xpert Xpress SARS-CoV-2/FLU/RSV plus assay is intended as an aid in the diagnosis of influenza from Nasopharyngeal swab specimens and should not be used as a sole basis for treatment. Nasal washings and aspirates are unacceptable for Xpert Xpress  SARS-CoV-2/FLU/RSV testing.  Fact Sheet for Patients: BloggerCourse.com  Fact Sheet for Healthcare Providers: SeriousBroker.it  This test is not yet approved or cleared by the [place] FDA and has been authorized for detection and/or diagnosis of SARS-CoV-2 by FDA under an Emergency Use Authorization (EUA). This EUA will remain in effect (meaning this test can be used) for the duration of the COVID-19 declaration under Section 564(b)(1) of the Act, 21 U.S.C. section 360bbb-3(b)(1), unless the authorization is terminated or revoked.  Performed at [facility], [address], [place], [place] [address]   Comprehensive metabolic panel     Status: Abnormal   Collection Time: 02/02/22 12:15 AM  Result Value Ref Range   Sodium 138 135 - 145 mmol/L   Potassium 3.6 3.5 - 5.1 mmol/L   Chloride 106 98 - 111 mmol/L   CO2 22 22 - 32 mmol/L   Glucose, Bld 102 (H) 70 - 99 mg/dL    Comment: Glucose reference range applies only to samples taken after fasting for at least 8 hours.   BUN 9 6 - 20 mg/dL   Creatinine, Ser 1.09 0.44 - 1.00 mg/dL   Calcium 9.2 8.9 - 32.3 mg/dL   Total Protein 8.0 6.5 - 8.1 g/dL   Albumin 3.6 3.5 - 5.0 g/dL   AST 46 (H) 15 - 41 U/L   ALT 79 (H) 0 - 44 U/L   Alkaline Phosphatase 83 38 - 126 U/L   Total Bilirubin 1.0 0.3 - 1.2 mg/dL   GFR, Estimated >55 >73 mL/min    Comment: (NOTE) Calculated using the CKD-EPI Creatinine Equation (2021)    Anion gap 10 5 - 15    Comment: Performed at [facility], [address] [place], [place] [address]  Ethanol     Status: None   Collection Time: 02/02/22 12:15 AM  Result Value Ref Range   Alcohol, Ethyl (B) <10 <10 mg/dL    Comment: (NOTE) Lowest detectable limit for serum alcohol is 10 mg/dL.  For medical purposes only. Performed at [facility], [address] [place], [place] [address]   Urine rapid  drug screen (hosp performed)     Status: None   Collection Time: 02/02/22 12:15 AM  Result Value Ref Range   Opiates NONE DETECTED NONE DETECTED   Cocaine NONE DETECTED NONE DETECTED   Benzodiazepines NONE DETECTED NONE DETECTED   Amphetamines NONE DETECTED NONE DETECTED   Tetrahydrocannabinol NONE DETECTED NONE DETECTED   Barbiturates NONE DETECTED NONE DETECTED    Comment: (NOTE) DRUG SCREEN FOR MEDICAL PURPOSES ONLY.  IF CONFIRMATION IS NEEDED FOR ANY PURPOSE, NOTIFY LAB WITHIN 5 DAYS.  LOWEST DETECTABLE LIMITS FOR URINE DRUG SCREEN Drug Class                     Cutoff (ng/mL) Amphetamine and metabolites    1000 Barbiturate and metabolites    200 Benzodiazepine                 200 Tricyclics and metabolites     300 Opiates and metabolites        300 Cocaine and metabolites        300 THC                            50 Performed at [facility], [address], [place], [place] [address]   CBC with Diff     Status: None   Collection Time: 02/02/22 12:15 AM  Result Value Ref Range   WBC 8.6 4.0 - 10.5 K/uL   RBC 4.52 3.87 - 5.11 MIL/uL   Hemoglobin 12.0 12.0 - 15.0 g/dL   HCT 83.1 51.7 - 61.6 %   MCV 81.6 80.0 - 100.0 fL   MCH 26.5 26.0 - 34.0 pg   MCHC 32.5 30.0 - 36.0 g/dL   RDW 07.3 71.0 - 62.6 %   Platelets 257 150 - 400 K/uL   nRBC 0.0  0.0 - 0.2 %   Neutrophils Relative % 63 %   Neutro Abs 5.4 1.7 - 7.7 K/uL   Lymphocytes Relative 28 %   Lymphs Abs 2.4 0.7 - 4.0 K/uL   Monocytes Relative 7 %   Monocytes Absolute 0.6 0.1 - 1.0 K/uL   Eosinophils Relative 2 %   Eosinophils Absolute 0.2 0.0 - 0.5 K/uL   Basophils Relative 0 %   Basophils Absolute 0.0 0.0 - 0.1 K/uL   Immature Granulocytes 0 %   Abs Immature Granulocytes 0.01 0.00 - 0.07 K/uL    Comment: Performed at [facility], [address] [place], [place] [address]  I-Stat beta hCG blood, ED     Status: None   Collection Time: 02/03/22 12:33 AM  Result Value Ref  Range   I-stat hCG, quantitative <5.0 <5 mIU/mL   Comment 3            Comment:   GEST. AGE      CONC.  (mIU/mL)   <=1 WEEK        5 - 50     2 WEEKS       50 - 500     3 WEEKS       100 - 10,000     4 WEEKS     1,000 - 30,000        FEMALE AND NON-PREGNANT FEMALE:     LESS THAN 5 mIU/mL     Current Facility-Administered Medications  Medication Dose Route Frequency Provider Last Rate Last Admin   norgestimate-ethinyl estradiol (ORTHO-CYCLEN) 0.25-35 MG-MCG tablet 1 tablet  1 tablet Oral Daily Carroll Sage, PA-C       Current Outpatient Medications  Medication Sig Dispense Refill   norgestimate-ethinyl estradiol (ORTHO-CYCLEN) 0.25-35 MG-MCG tablet Take 1 tablet by mouth daily.      Musculoskeletal: Strength & Muscle Tone: within normal limits Gait & Station: normal Patient leans: N/A   Psychiatric Specialty Exam: Presentation  General Appearance: Casual  Eye Contact:Fair  Speech:Clear and Coherent; Normal Rate  Speech Volume:Normal  Handedness:No data recorded  Mood and Affect  Mood:Anxious; Depressed; Hopeless; Worthless  Affect:Flat   Thought Process  Thought Processes:Coherent  Descriptions of Associations:Circumstantial  Orientation:Full (Time, Place and Person)  Thought Content:Delusions; Paranoid Ideation  History of Schizophrenia/Schizoaffective disorder:No  Duration of Psychotic Symptoms:Greater than six months  Hallucinations:Hallucinations: Other (comment) (patient declines to respond)  Ideas of Reference:Delusions; Paranoia  Suicidal Thoughts:Suicidal Thoughts: Yes, Passive SI Passive Intent and/or Plan: Without Intent; Without Plan  Homicidal Thoughts:Homicidal Thoughts: No   Sensorium  Memory:Immediate Good; Recent Good; Remote Good  Judgment:Impaired  Insight:Shallow   Executive Functions  Concentration:Fair  Attention Span:Fair  Recall:Good  Fund of Knowledge:Good  Language:Good   Psychomotor Activity   Psychomotor Activity:Psychomotor Activity: Normal   Assets  Assets:Communication Skills; Desire for Improvement; Housing; Social Support; Physical Health    Sleep  Sleep:Sleep: Fair   Physical Exam: Physical Exam Constitutional:      General: She is not in acute distress.    Appearance: She is not ill-appearing, toxic-appearing or diaphoretic.  Eyes:     General:        Right eye: No discharge.        Left eye: No discharge.  Cardiovascular:     Rate and Rhythm: Normal rate.  Pulmonary:     Effort: Pulmonary effort is normal. No respiratory distress.  Musculoskeletal:        General: Normal range of motion.  Cervical back: Normal range of motion.  Neurological:     Mental Status: She is alert and oriented to person, place, and time.  Psychiatric:        Mood and Affect: Mood is anxious and depressed. Affect is flat.        Behavior: Behavior is cooperative.        Thought Content: Thought content is delusional.    Review of Systems  Constitutional:  Positive for weight loss. Negative for chills, diaphoresis and fever.  Respiratory:  Negative for cough and shortness of breath.   Cardiovascular:  Negative for chest pain.  Gastrointestinal:  Negative for diarrhea, nausea and vomiting.  Psychiatric/Behavioral:  Positive for depression. Negative for substance abuse. The patient is nervous/anxious and has insomnia.    Blood pressure (!) 141/94, pulse 67, temperature 98.4 F (36.9 C), temperature source Oral, resp. rate 18, height 5\' 8"  (1.727 m), weight 117.9 kg, SpO2 100 %. Body mass index is 39.53 kg/m.  Medical Decision Making: Beth Harrington is a 26 y.o. female patient with a history of depression and anxiety who presented to [facility] under IVC. Patient was petitioned for IVC her friend Olena Mater [phone]).  On evaluation, the patient discusses that she feels witches have put a spell on her and they are controlling everything in her life.  She will not  respond to questions related to auditory hallucinations.  Patient reports a history of auditory hallucinations when she was 26 years old.  She denies a history of a thought disorder or bipolar disorder diagnosis.  Has a history of inpatient psychiatric admissions.  States that she has never had head/brain imaging.  Reports that she was prescribed olanzapine when she was 26 years old for auditory hallucinations, but did not continue taking it.  Denies a history of taking other psychotropic medications.   Plan to obtain head CT  At this time, the patient declines to start medications for depression or psychosis.  States that she prefers alternative treatments, such as walking and meditating.   Update: Head CT: Brain: No evidence of acute infarction, hemorrhage, hydrocephalus, extra-axial collection or mass lesion/mass effect. Vascular: No hyperdense vessel or unexpected calcification. Skull: Normal. Negative for fracture or focal lesion. Sinuses/Orbits: No acute finding. Other: None. IMPRESSION: No acute intracranial pathology.   Disposition: Recommend psychiatric Inpatient admission when medically cleared.  Rozetta Nunnery, NP 02/03/2022 11:23 AM

## 2022-02-03 NOTE — ED Notes (Signed)
[facility] transport contacted, message left with IVC transport request to [facility] for Pt and callback number.

## 2022-02-03 NOTE — ED Notes (Signed)
Pt was accepted to [place] 02/03/22; Bed Assignment [facility]  Pt meets inpatient criteria per Lindon Romp, NP  Attending Physician will be Dr. Lavella Lemons  Report can be called to: [phone]  Pt can arrive after: [place] Team notified: Laurena Spies, RN, and Lindon Romp, NP
